# Patient Record
Sex: Male | Born: 1980 | Race: White | Hispanic: No | Smoking: Current every day smoker
Health system: Southern US, Community
[De-identification: ages and names within clinical notes are randomized; demographics above are authoritative.]

## PROBLEM LIST (undated history)

## (undated) DIAGNOSIS — I10 Essential (primary) hypertension: Secondary | ICD-10-CM

## (undated) DIAGNOSIS — F419 Anxiety disorder, unspecified: Secondary | ICD-10-CM

## (undated) DIAGNOSIS — F329 Major depressive disorder, single episode, unspecified: Secondary | ICD-10-CM

## (undated) DIAGNOSIS — R569 Unspecified convulsions: Secondary | ICD-10-CM

## (undated) DIAGNOSIS — F32A Depression, unspecified: Secondary | ICD-10-CM

## (undated) DIAGNOSIS — F319 Bipolar disorder, unspecified: Secondary | ICD-10-CM

## (undated) DIAGNOSIS — N200 Calculus of kidney: Secondary | ICD-10-CM

## (undated) HISTORY — PX: WRIST SURGERY: SHX841

## (undated) HISTORY — PX: HAND SURGERY: SHX662

---

## 2011-07-30 ENCOUNTER — Inpatient Hospital Stay: Payer: Self-pay | Admitting: Psychiatry

## 2011-07-30 LAB — CBC
HCT: 53.5 % — ABNORMAL HIGH (ref 40.0–52.0)
MCHC: 33.7 g/dL (ref 32.0–36.0)
MCV: 90 fL (ref 80–100)
RDW: 14 % (ref 11.5–14.5)
WBC: 7.7 10*3/uL (ref 3.8–10.6)

## 2011-07-30 LAB — COMPREHENSIVE METABOLIC PANEL
Anion Gap: 9 (ref 7–16)
BUN: 5 mg/dL — ABNORMAL LOW (ref 7–18)
Creatinine: 0.97 mg/dL (ref 0.60–1.30)
EGFR (African American): >60
EGFR (Non-African Amer.): >60
Glucose: 95 mg/dL (ref 65–99)
Osmolality: 280 (ref 275–301)
Potassium: 3.8 mmol/L (ref 3.5–5.1)
Sodium: 142 mmol/L (ref 136–145)

## 2011-07-30 LAB — DRUG SCREEN, URINE
Amphetamines, Ur Screen: NEGATIVE (ref ?–1000)
Cocaine Metabolite,Ur ~~LOC~~: NEGATIVE (ref ?–300)
MDMA (Ecstasy)Ur Screen: NEGATIVE (ref ?–500)
Opiate, Ur Screen: NEGATIVE (ref ?–300)
Phencyclidine (PCP) Ur S: NEGATIVE (ref ?–25)

## 2011-07-30 LAB — ACETAMINOPHEN LEVEL: Acetaminophen: <2 ug/mL

## 2011-07-30 LAB — URINALYSIS, COMPLETE
Bacteria: NONE SEEN
Nitrite: NEGATIVE
Protein: 30
RBC,UR: NONE SEEN /HPF (ref 0–5)
Specific Gravity: 1.021 (ref 1.003–1.030)
WBC UR: NONE SEEN /HPF (ref 0–5)

## 2011-07-30 LAB — SALICYLATE LEVEL: Salicylates, Serum: <1.7 mg/dL

## 2011-07-31 LAB — BEHAVIORAL MEDICINE 1 PANEL
Alkaline Phosphatase: 93 U/L (ref 50–136)
Anion Gap: 9 (ref 7–16)
Basophil %: 0.9 %
Calcium, Total: 8.9 mg/dL (ref 8.5–10.1)
Eosinophil %: 3.6 %
Glucose: 97 mg/dL (ref 65–99)
HCT: 49 % (ref 40.0–52.0)
Lymphocyte #: 2.4 10*3/uL (ref 1.0–3.6)
MCH: 30.8 pg (ref 26.0–34.0)
MCV: 90 fL (ref 80–100)
Monocyte #: 0.7 x10 3/mm (ref 0.2–1.0)
Neutrophil #: 4.2 10*3/uL (ref 1.4–6.5)
Osmolality: 281 (ref 275–301)
Platelet: 215 10*3/uL (ref 150–440)
Potassium: 3.7 mmol/L (ref 3.5–5.1)
RBC: 5.45 10*6/uL (ref 4.40–5.90)
SGOT(AST): 20 U/L (ref 15–37)
Sodium: 141 mmol/L (ref 136–145)

## 2011-09-22 ENCOUNTER — Emergency Department (HOSPITAL_COMMUNITY): Payer: PRIVATE HEALTH INSURANCE

## 2011-09-22 ENCOUNTER — Encounter (HOSPITAL_COMMUNITY): Payer: Self-pay | Admitting: Emergency Medicine

## 2011-09-22 ENCOUNTER — Emergency Department (HOSPITAL_COMMUNITY)
Admission: EM | Admit: 2011-09-22 | Discharge: 2011-09-22 | Payer: PRIVATE HEALTH INSURANCE | Attending: Emergency Medicine | Admitting: Emergency Medicine

## 2011-09-22 DIAGNOSIS — T07XXXA Unspecified multiple injuries, initial encounter: Secondary | ICD-10-CM

## 2011-09-22 DIAGNOSIS — S20219A Contusion of unspecified front wall of thorax, initial encounter: Secondary | ICD-10-CM | POA: Insufficient documentation

## 2011-09-22 DIAGNOSIS — F319 Bipolar disorder, unspecified: Secondary | ICD-10-CM | POA: Insufficient documentation

## 2011-09-22 DIAGNOSIS — F341 Dysthymic disorder: Secondary | ICD-10-CM | POA: Insufficient documentation

## 2011-09-22 DIAGNOSIS — Z23 Encounter for immunization: Secondary | ICD-10-CM | POA: Insufficient documentation

## 2011-09-22 DIAGNOSIS — Y92410 Unspecified street and highway as the place of occurrence of the external cause: Secondary | ICD-10-CM | POA: Insufficient documentation

## 2011-09-22 DIAGNOSIS — I10 Essential (primary) hypertension: Secondary | ICD-10-CM | POA: Insufficient documentation

## 2011-09-22 HISTORY — DX: Essential (primary) hypertension: I10

## 2011-09-22 HISTORY — DX: Major depressive disorder, single episode, unspecified: F32.9

## 2011-09-22 HISTORY — DX: Depression, unspecified: F32.A

## 2011-09-22 HISTORY — DX: Bipolar disorder, unspecified: F31.9

## 2011-09-22 HISTORY — DX: Unspecified convulsions: R56.9

## 2011-09-22 HISTORY — DX: Anxiety disorder, unspecified: F41.9

## 2011-09-22 HISTORY — DX: Calculus of kidney: N20.0

## 2011-09-22 LAB — CBC WITH DIFFERENTIAL/PLATELET
Basophils Absolute: 0 10*3/uL (ref 0.0–0.1)
Basophils Relative: 1 % (ref 0–1)
Eosinophils Absolute: 0.2 10*3/uL (ref 0.0–0.7)
Eosinophils Relative: 2 % (ref 0–5)
Lymphocytes Relative: 13 % (ref 12–46)
MCHC: 35.4 g/dL (ref 30.0–36.0)
MCV: 86.6 fL (ref 78.0–100.0)
Platelets: 221 10*3/uL (ref 150–400)
RDW: 13.3 % (ref 11.5–15.5)
WBC: 8.6 10*3/uL (ref 4.0–10.5)

## 2011-09-22 LAB — BASIC METABOLIC PANEL
CO2: 26 mEq/L (ref 19–32)
Calcium: 9.8 mg/dL (ref 8.4–10.5)
Creatinine, Ser: 1.07 mg/dL (ref 0.50–1.35)
GFR calc Af Amer: >90 mL/min (ref >90)
GFR calc non Af Amer: >90 mL/min (ref >90)
Sodium: 130 mEq/L — ABNORMAL LOW (ref 135–145)

## 2011-09-22 LAB — TROPONIN I: Troponin I: <0.3 ng/mL (ref <0.30)

## 2011-09-22 MED ORDER — LISINOPRIL 10 MG PO TABS
10.0000 mg | ORAL_TABLET | Freq: Every day | ORAL | Status: DC
  Administered 2011-09-22: 10 mg via ORAL
  Filled 2011-09-22 (×2): qty 1

## 2011-09-22 MED ORDER — PANTOPRAZOLE SODIUM 40 MG PO TBEC
40.0000 mg | DELAYED_RELEASE_TABLET | Freq: Every day | ORAL | Status: DC
  Administered 2011-09-22: 40 mg via ORAL
  Filled 2011-09-22: qty 1

## 2011-09-22 MED ORDER — TETANUS-DIPHTH-ACELL PERTUSSIS 5-2.5-18.5 LF-MCG/0.5 IM SUSP
0.5000 mL | Freq: Once | INTRAMUSCULAR | Status: AC
  Administered 2011-09-22: 0.5 mL via INTRAMUSCULAR
  Filled 2011-09-22: qty 0.5

## 2011-09-22 MED ORDER — DIVALPROEX SODIUM 250 MG PO DR TAB
500.0000 mg | DELAYED_RELEASE_TABLET | Freq: Three times a day (TID) | ORAL | Status: DC
  Administered 2011-09-22: 500 mg via ORAL
  Filled 2011-09-22: qty 2

## 2011-09-22 MED ORDER — CLONIDINE HCL 0.1 MG PO TABS
0.1000 mg | ORAL_TABLET | Freq: Four times a day (QID) | ORAL | Status: DC
  Administered 2011-09-22: 0.1 mg via ORAL
  Filled 2011-09-22: qty 1

## 2011-09-22 MED ORDER — QUETIAPINE FUMARATE 100 MG PO TABS
300.0000 mg | ORAL_TABLET | Freq: Every day | ORAL | Status: DC
  Filled 2011-09-22: qty 3

## 2011-09-22 MED ORDER — QUETIAPINE FUMARATE 25 MG PO TABS
75.0000 mg | ORAL_TABLET | Freq: Two times a day (BID) | ORAL | Status: DC
  Filled 2011-09-22 (×3): qty 3

## 2011-09-22 MED ORDER — QUETIAPINE FUMARATE 50 MG PO TABS
75.0000 mg | ORAL_TABLET | Freq: Two times a day (BID) | ORAL | Status: DC
  Administered 2011-09-22: 75 mg via ORAL
  Filled 2011-09-22 (×3): qty 1

## 2011-09-22 NOTE — ED Notes (Signed)
Patient with no complaints at this time. Respirations even and unlabored. Skin warm/dry. Discharge instructions reviewed with patient at this time. Patient given opportunity to voice concerns/ask questions. IV removed per policy and band-aid applied to site. Patient discharged at this time and left Emergency Department with steady gait.  

## 2011-09-22 NOTE — ED Notes (Signed)
Patient brought in by officer [facility] Department. Patient last took medications on Friday, no nurse there to administer medications. Patient on meds for bipolar, htn, and seizures.
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
## 2014-05-09 NOTE — Discharge Summary (Signed)
PATIENT NAME:  Austin Grimes, Aiven MR#:  [id number] DATE OF BIRTH:  1980-09-02  DATE OF ADMISSION:  07/30/2011 DATE OF DISCHARGE:  08/05/2011  HISTORY OF PRESENT ILLNESS: Mr. Austin Grimes is a 34 year old male presenting to the Emergency Department with mood lability and impaired judgement.   He was placed under involuntary commitment after his mother petitioned. He had been experiencing greater than two weeks of depressed mood, anhedonia, poor concentration, poor appetite, thoughts of hopelessness and helplessness as well as insomnia. He had been off of his bipolar medication.    He had been facing legal charges of DWI and had been evicted from his house. He also was facing the stress of his mother having cancer. Also he was grieving the death of his stepson.    See the admission dictation.   ANCILLARY CLINICAL DATA: None.   HOSPITAL COURSE: Mr. Austin Grimes was admitted to the inpatient behavioral health unit and underwent milieu and group psychotherapy. He was also started on psychotropic medications.   He was placed on Depakote as his mood stabilizer at 500 mg b.i.d. and 1000 mg at bedtime. He was placed on Seroquel for augmenting Depakote and for antipsychosis at 400 mg at bedtime and 25 mg b.i.d.   His mood improved well.   CONDITION ON DISCHARGE: By the day of discharge, Mr. Austin Grimes was demonstrating appropriate social behavior. His mood and interest had returned to normal. He was showing good energy and concentration. He was talking of constructive future goals. He was not showing any adverse medication effect.   MENTAL STATUS EXAM UPON DISCHARGE: Mr. Austin Grimes is alert. His eye contact is good. Concentration is normal. He is oriented to all spheres. Memory is intact to immediate, recent, and remote. His intelligence, fund of knowledge, and use of language are normal. Abstraction is normal. Speech involves normal rate and prosody without dysarthria. Thought process is logical, coherent, and  goal directed. No looseness of associations. Thought content: No thoughts of harming himself. No thoughts of harming others. No delusions or hallucinations. Affect is broad and appropriate. Insight normal limits. Judgement is intact.   DISCHARGE DIAGNOSES:  AXIS I:  1. Bipolar disorder, mixed, stable.  2. Rule out intermittent explosive disorder, stable.   AXIS II: Deferred.   AXIS III: Hypertension.   AXIS IV: Primary support group, legal, bereavement.    AXIS V: 55.   Mr. Austin Grimes is not at risk to harm himself or others. He agrees to call emergency services immediately for any thoughts of harming himself, thoughts of harming others, or distress.   Mr. Austin Grimes will have follow up at [facility]. [facility] at 1:00 p.m.  DISCHARGE MEDICATIONS: 1. Clonidine 0.1 mg q.i.d.  2. Hydroxyzine 50 mg q.i.d. p.r.n. anxiety. 3. Lisinopril 10 mg daily.  4. Depakote 500 mg b.i.d. and 1000 mg at bedtime.  5. Seroquel 25 mg b.i.d. and 400 mg at bedtime. 6. Prilosec 20 mg b.i.d.   ACTIVITY: Routine.   DIET: Regular. ____________________________ Adelene AmasJames S. Jonathyn Carothers, MD jsw:cms D: 08/11/2011 23:17:07 ET T: 08/12/2011 05:33:41 ET  JOB#: [id number] Lester CarolinaJAMES S Simmie Garin MD ELECTRONICALLY SIGNED 08/12/2011 23:38
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
